# Patient Record
Sex: Female | Born: 1945
Health system: Southern US, Community
[De-identification: ages and names within clinical notes are randomized; demographics above are authoritative.]

## PROBLEM LIST (undated history)

## (undated) DIAGNOSIS — E785 Hyperlipidemia, unspecified: Secondary | ICD-10-CM

## (undated) DIAGNOSIS — Z8619 Personal history of other infectious and parasitic diseases: Secondary | ICD-10-CM

## (undated) HISTORY — DX: Personal history of other infectious and parasitic diseases: Z86.19

---

## 2005-11-07 ENCOUNTER — Emergency Department (HOSPITAL_COMMUNITY): Admission: EM | Admit: 2005-11-07 | Discharge: 2005-11-08 | Payer: Self-pay | Admitting: Emergency Medicine

## 2013-03-05 ENCOUNTER — Encounter (HOSPITAL_COMMUNITY): Payer: Self-pay | Admitting: Emergency Medicine

## 2013-03-05 ENCOUNTER — Emergency Department (HOSPITAL_COMMUNITY): Payer: Medicare Other

## 2013-03-05 ENCOUNTER — Emergency Department (HOSPITAL_COMMUNITY)
Admission: EM | Admit: 2013-03-05 | Discharge: 2013-03-06 | Disposition: A | Discharge status: Home / Self Care | Payer: Medicare Other | Attending: Emergency Medicine | Admitting: Emergency Medicine

## 2013-03-05 DIAGNOSIS — Z79899 Other long term (current) drug therapy: Secondary | ICD-10-CM | POA: Insufficient documentation

## 2013-03-05 DIAGNOSIS — J8410 Pulmonary fibrosis, unspecified: Secondary | ICD-10-CM | POA: Insufficient documentation

## 2013-03-05 DIAGNOSIS — Z9889 Other specified postprocedural states: Secondary | ICD-10-CM | POA: Insufficient documentation

## 2013-03-05 DIAGNOSIS — J189 Pneumonia, unspecified organism: Secondary | ICD-10-CM

## 2013-03-05 DIAGNOSIS — N390 Urinary tract infection, site not specified: Secondary | ICD-10-CM | POA: Insufficient documentation

## 2013-03-05 DIAGNOSIS — K5900 Constipation, unspecified: Secondary | ICD-10-CM | POA: Insufficient documentation

## 2013-03-05 DIAGNOSIS — E785 Hyperlipidemia, unspecified: Secondary | ICD-10-CM | POA: Insufficient documentation

## 2013-03-05 DIAGNOSIS — R112 Nausea with vomiting, unspecified: Secondary | ICD-10-CM | POA: Insufficient documentation

## 2013-03-05 DIAGNOSIS — R5381 Other malaise: Secondary | ICD-10-CM | POA: Insufficient documentation

## 2013-03-05 DIAGNOSIS — R630 Anorexia: Secondary | ICD-10-CM | POA: Insufficient documentation

## 2013-03-05 HISTORY — DX: Hyperlipidemia, unspecified: E78.5

## 2013-03-05 LAB — COMPREHENSIVE METABOLIC PANEL
AST: 35 U/L (ref 0–37)
BUN: 8 mg/dL (ref 6–23)
CO2: 21 mEq/L (ref 19–32)
Calcium: 8.9 mg/dL (ref 8.4–10.5)
Chloride: 92 mEq/L — ABNORMAL LOW (ref 96–112)
Creatinine, Ser: 0.5 mg/dL (ref 0.50–1.10)
GFR calc Af Amer: >90 mL/min (ref >90)
GFR calc non Af Amer: >90 mL/min (ref >90)
Total Bilirubin: 0.5 mg/dL (ref 0.3–1.2)

## 2013-03-05 LAB — CBC WITH DIFFERENTIAL/PLATELET
Basophils Absolute: 0 10*3/uL (ref 0.0–0.1)
Eosinophils Relative: 0 % (ref 0–5)
HCT: 36.3 % (ref 36.0–46.0)
Hemoglobin: 12.7 g/dL (ref 12.0–15.0)
Lymphocytes Relative: 7 % — ABNORMAL LOW (ref 12–46)
MCHC: 35 g/dL (ref 30.0–36.0)
MCV: 92.1 fL (ref 78.0–100.0)
Monocytes Absolute: 0.4 10*3/uL (ref 0.1–1.0)
Monocytes Relative: 5 % (ref 3–12)
Neutro Abs: 6.8 10*3/uL (ref 1.7–7.7)
RDW: 13.8 % (ref 11.5–15.5)
WBC: 7.7 10*3/uL (ref 4.0–10.5)

## 2013-03-05 LAB — URINALYSIS, ROUTINE W REFLEX MICROSCOPIC
Glucose, UA: NEGATIVE mg/dL
Ketones, ur: 15 mg/dL — AB
Protein, ur: 100 mg/dL — AB
pH: 7 (ref 5.0–8.0)

## 2013-03-05 LAB — LIPASE, BLOOD: Lipase: 18 U/L (ref 11–59)

## 2013-03-05 LAB — URINE MICROSCOPIC-ADD ON

## 2013-03-05 MED ORDER — SODIUM CHLORIDE 0.9 % IV BOLUS (SEPSIS)
500.0000 mL | Freq: Once | INTRAVENOUS | Status: AC
  Administered 2013-03-05: 500 mL via INTRAVENOUS

## 2013-03-05 MED ORDER — ONDANSETRON HCL 4 MG/2ML IJ SOLN
4.0000 mg | Freq: Once | INTRAMUSCULAR | Status: AC
  Administered 2013-03-05: 4 mg via INTRAVENOUS
  Filled 2013-03-05: qty 2

## 2013-03-05 MED ORDER — PROMETHAZINE HCL 25 MG PO TABS: 25.0000 mg | ORAL_TABLET | Freq: Four times a day (QID) | ORAL | Status: DC | PRN

## 2013-03-05 MED ORDER — TRAMADOL HCL 50 MG PO TABS: 50.0000 mg | ORAL_TABLET | Freq: Four times a day (QID) | ORAL | Status: DC | PRN

## 2013-03-05 NOTE — ED Notes (Signed)
Pt complains of generalized abdominal pain x2 days, also complains of generalized malaise and fever, also notes emesis. Was referred here from [facility].

## 2013-03-05 NOTE — ED Notes (Signed)
Upon speaking with pt she mentioned she had chest pain/pressure earlier today with som sob.

## 2013-03-05 NOTE — ED Notes (Signed)
EKG ordered and will notify dr asap

## 2013-03-05 NOTE — ED Provider Notes (Signed)
History    This chart was scribed for [name], MD by Melba Coon, ED Scribe. The patient was seen in room APA14/APA14 and the patient's care was started at 8:04PM.   CSN: [id number]  Arrival date & time 03/05/13  1945   First MD Initiated Contact with Patient 03/05/13 1957      Chief Complaint  Patient presents with  . Abdominal Pain    (Consider location/radiation/quality/duration/timing/severity/associated sxs/prior treatment) Patient is a 67 y.o. female presenting with abdominal pain. The history is provided by the patient. No language interpreter was used.  Abdominal Pain Pain location:  Generalized Pain quality: burning   Pain radiates to:  Does not radiate Pain severity:  Moderate Onset quality:  Gradual Duration:  2 days Timing:  Constant Progression:  Worsening Chronicity:  New Ineffective treatments: rocephin and levaquin. Associated symptoms: constipation, nausea and vomiting   Associated symptoms: no cough and no diarrhea    Michelle Odom is a 67 y.o. female who presents to the Emergency Department complaining of constant, moderate to severe generalized, burning abdominal pain with an onset 3-4 days ago with associated general malaise and emesis. She was referred here from [facility]. She went there yesertday was diagnosed with UTI and prescribed rocephin and Levaquin which did not alleviate her symptoms. She called [facility] today and told them that her symptoms had worsened. They referred her to the ED. She reports she had a fever that started 3 days ago and that it has not been alleviated; however, her body temperature here at the ED today is 97.6. Reports decreased appetite and fluid intake and constipation. Denies diarrhea and cough. Reports history of C-section about 40 years ago. No known allergies. No other pertinent medical symptoms.   Past Medical History  Diagnosis Date  . Hyperlipemia     History reviewed. No pertinent past surgical  history.  History reviewed. No pertinent family history.  History  Substance Use Topics  . Smoking status: Never Smoker   . Smokeless tobacco: Not on file  . Alcohol Use: No    OB History   Grav Para Term Preterm Abortions TAB SAB Ect Mult Living                  Review of Systems  Constitutional: Positive for appetite change (Decreased fluid intake and appetite).       (+) malaise  Respiratory: Negative for cough.   Gastrointestinal: Positive for nausea, vomiting, abdominal pain and constipation. Negative for diarrhea.  All other systems reviewed and are negative.    Allergies  Review of patient's allergies indicates no known allergies.  Home Medications  No current outpatient prescriptions on file.  BP 141/84  Pulse 84  Temp(Src) 97.6 F (36.4 C) (Oral)  Resp 16  Ht 5' 3.5" (1.613 m)  Wt 127 lb (57.607 kg)  BMI 22.14 kg/m2  SpO2 99%  Physical Exam  Nursing note and vitals reviewed. Constitutional: She is oriented to person, place, and time. She appears well-developed.  HENT:  Head: Normocephalic and atraumatic.  Mouth/Throat: Mucous membranes are dry.  Eyes: Conjunctivae and EOM are normal. No scleral icterus.  Neck: Neck supple. No thyromegaly present.  Cardiovascular: Normal rate and regular rhythm.  Exam reveals no gallop and no friction rub.   No murmur heard. Pulmonary/Chest: No stridor. She has no wheezes. She has no rales. She exhibits no tenderness.  Abdominal: She exhibits no distension. There is tenderness (mild; diffuse). There is no rebound.  Musculoskeletal:  Normal range of motion. She exhibits no edema.  Lymphadenopathy:    She has no cervical adenopathy.  Neurological: She is oriented to person, place, and time. Coordination normal.  Skin: No rash noted. No erythema.  Psychiatric: She has a normal mood and affect. Her behavior is normal.    ED Course  Procedures (including critical care time)  DIAGNOSTIC STUDIES: Oxygen Saturation is  99% on room air, normal by my interpretation.    COORDINATION OF CARE:  8:08PM - IV fluids, Zofran, abdominal acute XR with chest, CBC with differential, CMP, lipase, and UA will be ordered for Michelle Odom.   8:50PM - lab results reviewed Labs Reviewed  URINALYSIS, ROUTINE W REFLEX MICROSCOPIC - Abnormal; Notable for the following:    APPearance HAZY (*)    Hgb urine dipstick LARGE (*)    Ketones, ur 15 (*)    Protein, ur 100 (*)    Urobilinogen, UA 2.0 (*)    All other components within normal limits  CBC WITH DIFFERENTIAL - Abnormal; Notable for the following:    Neutrophils Relative 88 (*)    Lymphocytes Relative 7 (*)    Lymphs Abs 0.6 (*)    All other components within normal limits  COMPREHENSIVE METABOLIC PANEL - Abnormal; Notable for the following:    Sodium 127 (*)    Potassium 3.2 (*)    Chloride 92 (*)    Glucose, Bld 132 (*)    Albumin 3.1 (*)    All other components within normal limits  URINE MICROSCOPIC-ADD ON - Abnormal; Notable for the following:    Bacteria, UA FEW (*)    All other components within normal limits  LIPASE, BLOOD    9:20PM - imaging results reviewed Dg Abd Acute W/chest  03/05/2013  *RADIOLOGY REPORT*  Clinical Data: Fever, abdominal pain  ACUTE ABDOMEN SERIES (ABDOMEN 2 VIEW & CHEST 1 VIEW)  Comparison: 12/20 02/2005 chest radiograph  Findings: Mild left lung base opacity.  Lungs are otherwise clear. Cardiomediastinal contours within normal range.  Aortic atherosclerosis.  No free intraperitoneal air.  Bowel gas pattern nonobstructive. Calcific densities projecting over the right upper quadrant may reflect gallstones.  No acute osseous finding.  IMPRESSION: Nonobstructive bowel gas pattern.  Mild left lung base opacities; atelectasis (favored) versus early infiltrate.  Calcific densities projecting over the right upper quadrant may reflect gallstones.   Original Report Authenticated By: Jearld Lesch, M.D.    9:45PM - abdominal CT without  contrast will be ordered  No diagnosis found.    MDM   The chart was scribed for me under my direct supervision.  I personally performed the history, physical, and medical decision making and all procedures in the evaluation of this patient.[name], MD 03/08/13 [phone]

## 2014-07-01 ENCOUNTER — Emergency Department (INDEPENDENT_AMBULATORY_CARE_PROVIDER_SITE_OTHER)
Admission: EM | Admit: 2014-07-01 | Discharge: 2014-07-01 | Disposition: A | Discharge status: Home / Self Care | Payer: Medicare Other | Source: Home / Self Care | Attending: Emergency Medicine | Admitting: Emergency Medicine

## 2014-07-01 ENCOUNTER — Encounter (HOSPITAL_COMMUNITY): Payer: Self-pay | Admitting: Emergency Medicine

## 2014-07-01 ENCOUNTER — Emergency Department (INDEPENDENT_AMBULATORY_CARE_PROVIDER_SITE_OTHER): Payer: Medicare Other

## 2014-07-01 DIAGNOSIS — F419 Anxiety disorder, unspecified: Secondary | ICD-10-CM

## 2014-07-01 DIAGNOSIS — K2100 Gastro-esophageal reflux disease with esophagitis, without bleeding: Secondary | ICD-10-CM

## 2014-07-01 DIAGNOSIS — F411 Generalized anxiety disorder: Secondary | ICD-10-CM

## 2014-07-01 MED ORDER — GI COCKTAIL ~~LOC~~
ORAL | Status: AC
  Filled 2014-07-01: qty 30

## 2014-07-01 MED ORDER — SUCRALFATE 1 GM/10ML PO SUSP: 1.0000 g | Freq: Four times a day (QID) | ORAL | Status: DC

## 2014-07-01 MED ORDER — GI COCKTAIL ~~LOC~~
30.0000 mL | Freq: Once | ORAL | Status: AC
  Administered 2014-07-01: 30 mL via ORAL

## 2014-07-01 MED ORDER — LORAZEPAM 2 MG PO TABS: ORAL_TABLET | ORAL | Status: DC

## 2014-07-01 NOTE — Discharge Instructions (Signed)

## 2014-07-01 NOTE — ED Notes (Signed)
Pt reports chest d/c x 4 days Reports "it feels like heartburn"; increases w/food Also c/o weakness and anxiety Stopped taking Paxil x 2 months ago; not sleeping well Alert and talking in complete sentences w/no signs of acute distress.

## 2014-07-01 NOTE — ED Provider Notes (Signed)
Chief Complaint   Chief Complaint  Patient presents with  . Chest Pain    History of Present Illness    Michelle Odom is a 68 year old female who's had a one-week history of continuous substernal chest pain with radiation through to her back. This is described as a burning pain and is rated 7/10 in intensity. It's worse if she eats. It's not exertional. She's had indigestion and heartburn and she's taking Prilosec 20 mg once a day. She's had some burning in her throat, mild shortness of breath, nausea, sweats, chills, and abdominal discomfort. She denies any fever, coughing, wheezing, palpitations, dizziness, or syncope. She denies any cardiac history. She does have a history of reflux esophagitis. Also she had been on Paxil for anxiety for years. She decided to stop this about 2 months ago. She had a recurrence of her anxiety and was put on first half a pill a day the whole pill a day of Paxil 20 mg. She's only been on this for the last 1-2 weeks. She's having trouble sleeping at night and was placed on melatonin which does not help. She was then switched to clonazepam. This didn't help and she thought it made her heartburn and indigestion worse. She's felt nervous, anxious, and hasn't had much appetite and not been sleeping well at night.  Review of Systems    Other than noted above, the patient denies any of the following symptoms. Systemic:  No fever or chills. Pulmonary:  No cough, wheezing, shortness of breath, sputum production, hemoptysis. Cardiac:  No palpitations, rapid heartbeat, dizziness, presyncope or syncope. GI:  No abdominal pain, heartburn, nausea, or vomiting. Ext:  No leg pain or swelling.  PMFSH    Past medical history, family history, social history, meds, and allergies were reviewed.   Physical Exam     Vital signs:  BP 146/81  Pulse 71  Temp(Src) 98.8 F (37.1 C) (Oral)  Resp 20  SpO2 99% Gen:  Alert, oriented, in no distress, skin warm and dry. Eye:  PERRL,  lids and conjunctivas normal.  Sclera non-icteric. ENT:  Mucous membranes moist, pharynx clear. Neck:  Supple, no adenopathy or tenderness.  No JVD. Lungs:  Clear to auscultation, no wheezes, rales or rhonchi.  No respiratory distress. Heart:  Regular rhythm.  No gallops, murmers, clicks or rubs. Chest:  No chest wall tenderness. Abdomen:  Soft, nontender, no organomegaly or mass.  Bowel sounds normal.  No pulsatile abdominal mass or bruit. Ext:  No edema.  No calf tenderness and Homann's sign negative.  Pulses full and equal. Skin:  Warm and dry.  No rash.   Radiology     Dg Chest 2 View  07/01/2014   CLINICAL DATA:  Substernal chest pain.  EXAM: CHEST  2 VIEW  COMPARISON:  03/05/2013, CT and chest radiograph dated 11/08/2005.  FINDINGS: The heart size and mediastinal contours are within normal limits. Both lungs are clear. The visualized skeletal structures are unremarkable.  IMPRESSION: No active cardiopulmonary disease.   Electronically Signed   By: Amie Portlandavid  Ormond M.D.   On: 07/01/2014 09:57   I reviewed the images independently and personally and concur with the radiologist's findings.  EKG Results:  Date: 07/01/2014  Rate: 69  Rhythm: normal sinus rhythm  QRS Axis: normal--54  Intervals: normal  ST/T Wave abnormalities: normal  Conduction Disutrbances:none  Narrative Interpretation: Normal sinus rhythm, possibly left atrial enlargement, no significant change since previous tracing.  Old EKG Reviewed: none available    [facility]         She was given 30 mL of GI cocktail and experienced relief of her symptoms.                                                                                                                                                       Assessment     The primary encounter diagnosis was Gastroesophageal reflux disease with esophagitis. A diagnosis of Anxiety was also pertinent to this visit.  The chest pain is most likely due to reflux  esophagitis with a large component of anxiety as well. No evidence of acute coronary syndrome, pulmonary embolism, aortic dissection, pneumothorax, pericarditis, or esophageal rupture.  Plan     1.  Meds:  The following meds were prescribed:   Discharge Medication List as of 07/01/2014 10:41 AM    START taking these medications   Details  LORazepam (ATIVAN) 2 MG tablet 1/2 to 1 tablet every 8 hours as needed., Print    sucralfate (CARAFATE) 1 GM/10ML suspension Take 10 mLs (1 g total) by mouth 4 (four) times daily., Starting 07/01/2014, Until Discontinued, Normal        2.  Patient Education/Counseling:  The patient was given appropriate handouts, self care instructions, and instructed in symptomatic relief.   Discussed dietary measures for reflux.  3.  Follow up:  The patient was told to follow up here if no better in 3 to 4 days, or sooner if becoming worse in any way, and give an an some red flag symptoms such as worsening pain, shortness of breath, dizziness, or passing out which would prompt immediate return. Followup with a primary care physician within a week.     Reuben Likes, MD 07/01/14 [phone]

## 2015-09-08 ENCOUNTER — Encounter: Payer: Self-pay | Admitting: *Deleted

## 2015-09-08 DIAGNOSIS — Z0000 Encounter for general adult medical examination without abnormal findings: Secondary | ICD-10-CM

## 2015-09-10 NOTE — Congregational Nurse Program (Unsigned)
Congregational Nurse Program Note  Date of Encounter: 09/08/2015  Past Medical History: No past medical history on file.  Encounter Details:  Flu shot given   

## 2019-04-30 ENCOUNTER — Encounter: Payer: Self-pay | Admitting: Internal Medicine

## 2019-10-30 ENCOUNTER — Other Ambulatory Visit: Payer: Self-pay | Admitting: Gastroenterology

## 2019-10-30 ENCOUNTER — Other Ambulatory Visit (HOSPITAL_COMMUNITY): Payer: Self-pay | Admitting: Gastroenterology

## 2019-10-30 DIAGNOSIS — R1011 Right upper quadrant pain: Secondary | ICD-10-CM

## 2019-11-14 ENCOUNTER — Other Ambulatory Visit: Payer: Self-pay

## 2019-11-14 ENCOUNTER — Ambulatory Visit (HOSPITAL_COMMUNITY)
Admission: RE | Admit: 2019-11-14 | Discharge: 2019-11-14 | Disposition: A | Discharge status: Home / Self Care | Payer: Medicare Other | Source: Ambulatory Visit | Attending: Gastroenterology | Admitting: Gastroenterology

## 2019-11-14 ENCOUNTER — Encounter (HOSPITAL_COMMUNITY)
Admission: RE | Admit: 2019-11-14 | Discharge: 2019-11-14 | Disposition: A | Discharge status: Home / Self Care | Payer: Medicare Other | Source: Ambulatory Visit | Attending: Gastroenterology | Admitting: Gastroenterology

## 2019-11-14 DIAGNOSIS — R1011 Right upper quadrant pain: Secondary | ICD-10-CM | POA: Diagnosis present

## 2019-11-14 MED ORDER — TECHNETIUM TC 99M MEBROFENIN IV KIT
5.3000 | PACK | Freq: Once | INTRAVENOUS | Status: AC | PRN
  Administered 2019-11-14: 11:00:00 5.3 via INTRAVENOUS

## 2021-07-31 IMAGING — NM NM HEPATO W/GB/PHARM/[PERSON_NAME]
2 series · 12 of 12 positions shown · non-contrast
Comparison: None.

CLINICAL DATA: Chest and upper abdominal pain

EXAM:
NUCLEAR MEDICINE HEPATOBILIARY IMAGING WITH GALLBLADDER EF
VIEWS:
Sequential images of the abdomen were obtained [DATE] minutes
following intravenous administration of anterior right upper
quadrant
RADIOPHARMACEUTICALS:  5.3 mCi Ic-OOm  Choletec IV

[he hepatobiliary · 4.52mm/px · 6 of 60 frames shown (1 of 2)]
[frame 6/60]
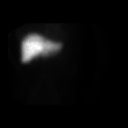
[frame 16/60]
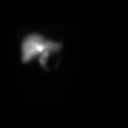
[frame 26/60]
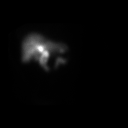
[frame 36/60]
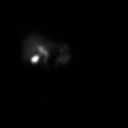
[frame 46/60]
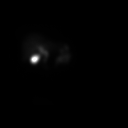
[frame 56/60]
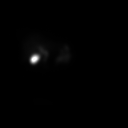

[he hepatobiliary · 4.52mm/px · 6 of 60 frames shown (2 of 2)]
[frame 6/60]
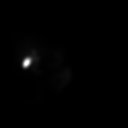
[frame 16/60]
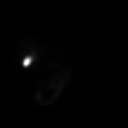
[frame 26/60]
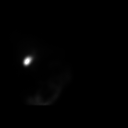
[frame 36/60]
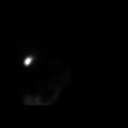
[frame 46/60]
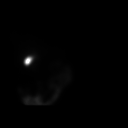
[frame 56/60]
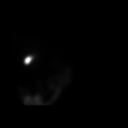

[12 of 12 positions shown; findings below may reference images not displayed]

FINDINGS: Liver uptake of radiotracer is unremarkable. There is prompt
visualization of gallbladder and small bowel, indicating patency of
the cystic and common bile ducts. The patient consumed 8 ounces of
Ensure orally with calculation of the computer generated ejection
fraction of radiotracer from the gallbladder. No report of clinical
symptoms with the oral Ensure consumption. The computer generated
ejection fraction of radiotracer from the gallbladder is within
normal limits at 44%, normal greater than 33% using the oral agent.
IMPRESSION: Study within normal limits.

## 2023-04-08 ENCOUNTER — Encounter (HOSPITAL_COMMUNITY): Payer: Self-pay | Admitting: Emergency Medicine

## 2023-04-08 ENCOUNTER — Other Ambulatory Visit: Payer: Self-pay

## 2023-04-08 ENCOUNTER — Emergency Department (HOSPITAL_COMMUNITY): Payer: Medicare Other

## 2023-04-08 ENCOUNTER — Emergency Department (HOSPITAL_COMMUNITY)
Admission: EM | Admit: 2023-04-08 | Discharge: 2023-04-08 | Disposition: A | Discharge status: Home / Self Care | Payer: Medicare Other | Attending: Emergency Medicine | Admitting: Emergency Medicine

## 2023-04-08 DIAGNOSIS — R079 Chest pain, unspecified: Secondary | ICD-10-CM | POA: Diagnosis present

## 2023-04-08 DIAGNOSIS — K2100 Gastro-esophageal reflux disease with esophagitis, without bleeding: Secondary | ICD-10-CM | POA: Diagnosis not present

## 2023-04-08 DIAGNOSIS — F419 Anxiety disorder, unspecified: Secondary | ICD-10-CM | POA: Insufficient documentation

## 2023-04-08 LAB — CBC
HCT: 40.9 % (ref 36.0–46.0)
Hemoglobin: 13.7 g/dL (ref 12.0–15.0)
MCH: 32.5 pg (ref 26.0–34.0)
MCHC: 33.5 g/dL (ref 30.0–36.0)
MCV: 97.1 fL (ref 80.0–100.0)
Platelets: 251 10*3/uL (ref 150–400)
RBC: 4.21 MIL/uL (ref 3.87–5.11)
RDW: 13.5 % (ref 11.5–15.5)
WBC: 6.9 10*3/uL (ref 4.0–10.5)
nRBC: 0 % (ref 0.0–0.2)

## 2023-04-08 LAB — BASIC METABOLIC PANEL
Anion gap: 9 (ref 5–15)
BUN: 15 mg/dL (ref 8–23)
CO2: 25 mmol/L (ref 22–32)
Calcium: 8.9 mg/dL (ref 8.9–10.3)
Chloride: 101 mmol/L (ref 98–111)
Creatinine, Ser: 0.48 mg/dL (ref 0.44–1.00)
GFR, Estimated: >60 mL/min (ref >60)
Glucose, Bld: 108 mg/dL — ABNORMAL HIGH (ref 70–99)
Potassium: 3.9 mmol/L (ref 3.5–5.1)
Sodium: 135 mmol/L (ref 135–145)

## 2023-04-08 LAB — TROPONIN I (HIGH SENSITIVITY)
Troponin I (High Sensitivity): 4 ng/L (ref <18)
Troponin I (High Sensitivity): 4 ng/L (ref <18)

## 2023-04-08 MED ORDER — LORAZEPAM 1 MG PO TABS
1.0000 mg | ORAL_TABLET | Freq: Once | ORAL | Status: AC
  Administered 2023-04-08: 1 mg via ORAL
  Filled 2023-04-08: qty 1

## 2023-04-08 MED ORDER — ALPRAZOLAM 0.5 MG PO TABS: 0.5000 mg | ORAL_TABLET | Freq: Two times a day (BID) | ORAL | 0 refills | Status: DC | PRN

## 2023-04-08 MED ORDER — SUCRALFATE 1 G PO TABS: 1.0000 g | ORAL_TABLET | Freq: Three times a day (TID) | ORAL | 0 refills | Status: AC

## 2023-04-08 MED ORDER — LIDOCAINE VISCOUS HCL 2 % MT SOLN
15.0000 mL | Freq: Once | OROMUCOSAL | Status: AC
  Administered 2023-04-08: 15 mL via ORAL
  Filled 2023-04-08: qty 15

## 2023-04-08 MED ORDER — ALUM & MAG HYDROXIDE-SIMETH 200-200-20 MG/5ML PO SUSP
30.0000 mL | Freq: Once | ORAL | Status: AC
  Administered 2023-04-08: 30 mL via ORAL
  Filled 2023-04-08: qty 30

## 2023-04-08 MED ORDER — PANTOPRAZOLE SODIUM 40 MG PO TBEC: 40.0000 mg | DELAYED_RELEASE_TABLET | Freq: Every day | ORAL | 0 refills | Status: AC

## 2023-04-08 NOTE — ED Triage Notes (Signed)
Pt via POV c/o central nonradiating, burning chest pain and anxiety since earlier this week. Pt takes desvenlafax and trazodone for anxiety with no improvement. Denies similar symptoms with previous panic attacks. Pt reports occasional episodes of nausea and sweating but denies other cardiac symptoms.

## 2023-04-08 NOTE — ED Provider Notes (Signed)
[place] [facility] AT [facility] Provider Note   CSN: [id number] Arrival date & time: 04/08/23  1534     History Chief Complaint  Patient presents with   Chest Pain   Panic Attack    Michelle Odom is a 77 y.o. female.  Patient presents emergency department complaints of chest pain/panic attack.  Past history significant for anxiety, depression, insomnia.  Patient recently had a change in medications and was started on desvenlafaxine and trazodone to help address her mental health.  She reports that she has not had significant improvement in symptoms/difficulty sleeping.  Has tried seeing Christain Sacramento, NP for psychiatry.   Chest Pain      Home Medications Prior to Admission medications   Medication Sig Start Date End Date Taking? Authorizing Provider  ALPRAZolam Prudy Feeler) 0.5 MG tablet Take 1 tablet (0.5 mg total) by mouth 2 (two) times daily as needed for anxiety. 04/08/23  Yes Smitty Knudsen, PA-C  desvenlafaxine (PRISTIQ) 25 MG 24 hr tablet Take 25 mg by mouth at bedtime. 03/17/23  Yes [provider]  ibandronate (BONIVA) 150 MG tablet Take 150 mg by mouth every 30 (thirty) days. 01/19/23  Yes [provider]  pantoprazole (PROTONIX) 40 MG tablet Take 1 tablet (40 mg total) by mouth daily. 04/08/23 05/08/23 Yes Smitty Knudsen, PA-C  sucralfate (CARAFATE) 1 g tablet Take 1 tablet (1 g total) by mouth 4 (four) times daily -  with meals and at bedtime for 14 days. 04/08/23 04/22/23 Yes Smitty Knudsen, PA-C  tetrahydrozoline (EYE DROPS) 0.05 % ophthalmic solution Place 1 drop into both eyes daily as needed (for irritation).   Yes [provider]  traZODone (DESYREL) 100 MG tablet Take 100 mg by mouth at bedtime. 03/17/23  Yes [provider]      Allergies    Patient has no known allergies.    Review of Systems   Review of Systems  Cardiovascular:  Positive for chest pain.  All other systems reviewed and are negative.   Physical  Exam Updated Vital Signs BP 127/85 (BP Location: Left Arm)   Pulse 71   Temp 98.2 F (36.8 C) (Oral)   Resp 18   Ht 5' 3.5" (1.613 m)   Wt 47.2 kg   SpO2 99%   BMI 18.13 kg/m  Physical Exam Vitals and nursing note reviewed.  Constitutional:      General: She is not in acute distress.    Appearance: She is well-developed.  HENT:     Head: Normocephalic and atraumatic.  Eyes:     Conjunctiva/sclera: Conjunctivae normal.  Cardiovascular:     Rate and Rhythm: Normal rate and regular rhythm.     Heart sounds: Normal heart sounds. No murmur heard. Pulmonary:     Effort: Pulmonary effort is normal. No respiratory distress.     Breath sounds: Normal breath sounds.  Abdominal:     Palpations: Abdomen is soft.     Tenderness: There is no abdominal tenderness.  Musculoskeletal:        General: No swelling.     Cervical back: Neck supple.  Skin:    General: Skin is warm and dry.     Capillary Refill: Capillary refill takes less than 2 seconds.  Neurological:     Mental Status: She is alert.  Psychiatric:        Mood and Affect: Mood is anxious.        Behavior: Behavior normal. Behavior is not agitated.  ED Results / Procedures / Treatments   Labs (all labs ordered are listed, but only abnormal results are displayed) Labs Reviewed  BASIC METABOLIC PANEL - Abnormal; Notable for the following components:      Result Value   Glucose, Bld 108 (*)    All other components within normal limits  CBC  TROPONIN I (HIGH SENSITIVITY)  TROPONIN I (HIGH SENSITIVITY)    EKG EKG Interpretation  Date/Time:  Thursday Apr 08 2023 15:50:33 EDT Ventricular Rate:  81 PR Interval:  148 QRS Duration: 76 QT Interval:  374 QTC Calculation: 434 R Axis:   27 Text Interpretation: Normal sinus rhythm Normal ECG When compared with ECG of 01-Jul-2014 09:25, No significant change was found No acute changes No significant change since last tracing Confirmed by Derwood Kaplan ([id number]) on  04/08/2023 7:42:34 PM  Radiology DG Chest 2 View  Result Date: 04/08/2023 CLINICAL DATA:  Central burning chest pain. EXAM: CHEST - 2 VIEW COMPARISON:  07/01/2014 FINDINGS: The heart size and mediastinal contours are within normal limits. Mild bibasilar atelectasis. There is no evidence of pulmonary edema, consolidation, pneumothorax, nodule or pleural fluid. The visualized skeletal structures are unremarkable. IMPRESSION: Mild bibasilar atelectasis. Electronically Signed   By: Irish Lack M.D.   On: 04/08/2023 16:12    Procedures Procedures   Medications Ordered in ED Medications  LORazepam (ATIVAN) tablet 1 mg (1 mg Oral Given 04/08/23 1821)  alum & mag hydroxide-simeth (MAALOX/MYLANTA) 200-200-20 MG/5ML suspension 30 mL (30 mLs Oral Given 04/08/23 1821)    And  lidocaine (XYLOCAINE) 2 % viscous mouth solution 15 mL (15 mLs Oral Given 04/08/23 1821)    ED Course/ Medical Decision Making/ A&P                           Medical Decision Making Amount and/or Complexity of Data Reviewed Labs: ordered. Radiology: ordered.  Risk OTC drugs. Prescription drug management.   This patient presents to the ED for concern of chest pain, and panic attack.  Differential diagnosis includes ACS, esophagitis, GERD, panic attack, generalized anxiety disorder   Lab Tests:  I Ordered, and personally interpreted labs.  The pertinent results include: CBC and BMP unremarkable, troponin negative   Imaging Studies ordered:  I ordered imaging studies including chest x-ray I independently visualized and interpreted imaging which showed no acute cardiopulmonary process I agree with the radiologist interpretation   Medicines ordered and prescription drug management:  I ordered medication including lorazepam, Maalox/Mylanta for anxiety, acid reflux Reevaluation of the patient after these medicines showed that the patient improved I have reviewed the patients home medicines and have made  adjustments as needed   Problem List / ED Course:  Patient presents emergency department complaints of chest pain and panic attacks.  States that she was recently started on desvenlafaxine and trazodone for anxiety and sleep.  States that sleep typically consist of 1/2-hour periodic "nap". Requesting to have referral to new psych provider arranged. Patient's symptoms concerning for potential anxiety component, but cardiac workup initiated with normal EKG and negative troponin. All other labs unremarkable.  Low concern the patient's symptoms are due to an acute MI at this time. Patient experienced symptomatic improvement with Lorazepam, and Maalox. High suspicion that symptoms are due to poorly managed anxiety.  Patient provided with referral to new psychiatrist per request.  Low-dose prescription for Xanax, Protonix, Carafate sent to patient's pharmacy.  Patient agreeable treatment plan verbalized understanding return precautions.  All  questions answered prior to patient discharge.  Final Clinical Impression(s) / ED Diagnoses Final diagnoses:  Anxiety  Gastroesophageal reflux disease with esophagitis without hemorrhage    Rx / DC Orders ED Discharge Orders          Ordered    sucralfate (CARAFATE) 1 g tablet  3 times daily with meals & bedtime        04/08/23 2128    pantoprazole (PROTONIX) 40 MG tablet  Daily        04/08/23 2128    ALPRAZolam (XANAX) 0.5 MG tablet  2 times daily PRN        04/08/23 2128    Ambulatory referral to Psychiatry        04/08/23 2129              Salomon Mast 04/08/23 2232    Derwood Kaplan, MD 04/10/23 1513

## 2023-04-08 NOTE — Discharge Instructions (Addendum)
You were seen in the emergency department for chest pain and anxiety. Thankfully your labs and imaging were reassuring without any acute findings noted. I would advise that you follow up with a psychiatrist regarding concerns for anxiety. Medications for acid reflux and anxiety were sent to your pharmacy. Please take these as prescribed. Otherwise, follow up with your primary care provider as needed. If you are concerned that your symptoms are worsening, please return to the ED for further evaluation.

## 2023-05-17 ENCOUNTER — Encounter (HOSPITAL_COMMUNITY): Payer: Self-pay | Admitting: Student

## 2023-05-17 ENCOUNTER — Ambulatory Visit (HOSPITAL_BASED_OUTPATIENT_CLINIC_OR_DEPARTMENT_OTHER): Payer: Medicare Other | Admitting: Student

## 2023-05-17 VITALS — BP 122/75 | HR 81 | Ht 63.0 in | Wt 108.6 lb

## 2023-05-17 DIAGNOSIS — F332 Major depressive disorder, recurrent severe without psychotic features: Secondary | ICD-10-CM

## 2023-05-17 DIAGNOSIS — F320 Major depressive disorder, single episode, mild: Secondary | ICD-10-CM | POA: Insufficient documentation

## 2023-05-17 DIAGNOSIS — F411 Generalized anxiety disorder: Secondary | ICD-10-CM

## 2023-05-17 MED ORDER — TRAZODONE HCL 150 MG PO TABS: 150.0000 mg | ORAL_TABLET | Freq: Every day | ORAL | 1 refills | Status: DC

## 2023-05-17 MED ORDER — HYDROXYZINE HCL 10 MG PO TABS: 10.0000 mg | ORAL_TABLET | Freq: Three times a day (TID) | ORAL | 1 refills | Status: DC | PRN

## 2023-05-17 MED ORDER — FLUOXETINE HCL 10 MG PO CAPS: 10.0000 mg | ORAL_CAPSULE | Freq: Every day | ORAL | 2 refills | Status: DC
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
## 2023-05-21 NOTE — Addendum Note (Signed)
Addended by: Everlena Cooper on: 05/21/2023 12:56 PM   Modules accepted: Level of Service

## 2023-05-27 ENCOUNTER — Encounter: Payer: Self-pay | Admitting: Geriatric Medicine

## 2023-05-28 ENCOUNTER — Other Ambulatory Visit: Payer: Self-pay | Admitting: Geriatric Medicine

## 2023-05-28 DIAGNOSIS — R1319 Other dysphagia: Secondary | ICD-10-CM

## 2023-06-07 ENCOUNTER — Ambulatory Visit (HOSPITAL_COMMUNITY): Payer: Medicare Other | Admitting: Student

## 2023-06-10 ENCOUNTER — Ambulatory Visit
Admission: RE | Admit: 2023-06-10 | Discharge: 2023-06-10 | Disposition: A | Discharge status: Home / Self Care | Payer: Medicare Other | Source: Ambulatory Visit | Attending: Geriatric Medicine | Admitting: Geriatric Medicine

## 2023-06-10 ENCOUNTER — Other Ambulatory Visit: Payer: Medicare Other

## 2023-06-10 DIAGNOSIS — R1319 Other dysphagia: Secondary | ICD-10-CM

## 2023-06-14 ENCOUNTER — Ambulatory Visit (HOSPITAL_COMMUNITY): Payer: Medicare Other | Admitting: Student

## 2023-06-14 ENCOUNTER — Encounter (HOSPITAL_COMMUNITY): Payer: Self-pay | Admitting: Student

## 2023-06-14 DIAGNOSIS — F411 Generalized anxiety disorder: Secondary | ICD-10-CM

## 2023-06-14 DIAGNOSIS — F332 Major depressive disorder, recurrent severe without psychotic features: Secondary | ICD-10-CM

## 2023-06-14 NOTE — Progress Notes (Signed)
[facility] [address] [place] [address] [place] [address] Dept: [phone] Dept Fax: [phone]  Psychotherapy Progress Note  Patient ID: Michelle Odom, female  DOB: 04-23-1946, 77 y.o.  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 Negative thoughts. Questioning whether she made the right decision. Helping him daily; believes that he needs her. She fears that the business will fail without her. So far, he has been making the payments, but this month noticed slowing of business. Husband states that business is typically slower around this time. Does not know what to do, developed somatic sx, throat sensation and pain in back of head,   Religion is protective, but constantly worrying, which makes her feel bad. Tried to turn off, but coming back. She is questioning whether she is a good Christian due to her constant worrying.    Current Symptoms: Anxiety, Appetite problem, Depressed Mood, Irritability, and Sibling problem; Irritability over the past two weeks. Appetite poor but forcing self to eat. Not sleeping well; goes to bed at 11 PM, wakes at 1-2 AM for about 2 hours thinking. Unable to nap throuhgout the day, as thoughts do not turn off.   Psychiatric Specialty Exam: General Appearance: Casual  Eye Contact:  Good  Speech:  Clear and Coherent and Normal Rate  Volume:  Normal  Mood:  Anxious and Depressed  Affect:  Congruent and Depressed  Thought Process:  Goal Directed  Orientation:  Full (Time, Place, and Person)  Thought Content:  Rumination  Suicidal Thoughts:  No  Homicidal Thoughts:  No  Memory:  Immediate;   Good Recent;   Good Remote;   Good  Judgement:  Good  Insight:  Fair  Psychomotor Activity:  Normal  Concentration:  Concentration: Good and Attention Span:  Good  Recall:   Not formally assessed  Fund of Knowledge:Good  Language: Good  Akathisia:  No  Handed:  Right  AIMS (if indicated):  not done  Assets:  Communication Skills Desire for Improvement Financial Resources/Insurance Housing Intimacy Leisure Time Resilience Social Support Talents/Skills  ADL's:  Intact  Cognition: WNL  Sleep:  Poor     Diagnosis: MDD, GAD  Anticipated Frequency of Visits: q 2 weeks Anticipated Length of Treatment Episode: 3-6 months  Short Term Goals/Goals for Treatment Session: Personal inventory, Psychoeducation: Framework for CBT, stages of psychosocial development- Integrity vs. despair  Progress Towards Goals: Initial  Treatment Intervention: Cognitive therapy and Psychoeducation  Medical Necessity: Assisted patient to achieve or maintain maximum functional capacity  Assessment Tools:    05/17/2023    3:19 PM  Depression screen PHQ 2/9  Decreased Interest 1  Down, Depressed, Hopeless 3  PHQ - 2 Score 4  Altered sleeping 3  Tired, decreased energy 3  Change in appetite 3  Feeling bad or failure about yourself  3  Trouble concentrating 3  Moving slowly or fidgety/restless 0  Suicidal thoughts 0  PHQ-9 Score 19   Failed to redirect to the Timeline version of the REVFS SmartLink. Flowsheet Row Office Visit from 05/17/2023 in [facility] from 04/08/2023 in [facility] Department at [facility]  C-SSRS RISK CATEGORY Error: Question 6 not populated No Risk       Collaboration of Care: Dr. Mercy Riding, Dr. Melvyn Neth  Patient/Guardian was advised Release of Information must be  obtained prior to any record release in order to collaborate their care with an outside provider. Patient/Guardian was advised if they have not already done so to contact the registration department to sign all necessary forms in order for [place] to release information regarding their care.   Consent: Patient/Guardian  gives verbal consent for treatment and assignment of benefits for services provided during this visit. Patient/Guardian expressed understanding and agreed to proceed.   Plan: Answer: What have I done well over the course of my life? What makes a good Christian?   Follow-up in two weeks.   Lamar Sprinkles, MD 06/14/2023

## 2023-06-24 ENCOUNTER — Other Ambulatory Visit: Payer: Self-pay | Admitting: Geriatric Medicine

## 2023-06-24 ENCOUNTER — Ambulatory Visit
Admission: RE | Admit: 2023-06-24 | Discharge: 2023-06-24 | Disposition: A | Discharge status: Home / Self Care | Payer: Medicare Other | Source: Ambulatory Visit | Attending: Geriatric Medicine | Admitting: Geriatric Medicine

## 2023-06-24 DIAGNOSIS — R1319 Other dysphagia: Secondary | ICD-10-CM

## 2023-06-28 ENCOUNTER — Encounter (HOSPITAL_COMMUNITY): Payer: Self-pay | Admitting: Student

## 2023-06-28 ENCOUNTER — Ambulatory Visit (HOSPITAL_COMMUNITY): Payer: Medicare Other | Admitting: Student

## 2023-06-28 DIAGNOSIS — F332 Major depressive disorder, recurrent severe without psychotic features: Secondary | ICD-10-CM | POA: Diagnosis not present

## 2023-06-28 DIAGNOSIS — F411 Generalized anxiety disorder: Secondary | ICD-10-CM

## 2023-06-28 NOTE — Progress Notes (Signed)
[facility] [address] [place] [address] [place] [address] Dept: [phone] Dept Fax: [phone]  Psychotherapy Progress Note  Patient ID: Wylodean Lozeau, female  DOB: 1946/02/08, 77 y.o.  MRN: [id number]  06/28/2023 Start time: 1410 End time: 1510  Method of Visit: Face-to-Face  Present: family (husband), Dr. Jolene Provost, PhD  Current Concerns: Today, 06/28/23, Improvements: heaviness in her head, per patient's reporting, and sleep, per husband's reporting. She has concerns now that she has aching joints, wants to lie down. When explored further, her aches appear to be somatization of her anxiety, as it improves when she lies down and relaxes. Would like to r/o true muscle pathology; patient to visit with PCP tomorrow and advised to address. She did fairly well with assignment: offering the things she has done well and defining what makes a good [place]. However, her humility does not allow her to take credit for the things she has done well. Additionally, she lists off the characteristics of a good Christian, which are attributes she possesses, but she is uncertain of them.    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 Negative thoughts. Questioning whether she made the right decision. Helping him daily; believes that he needs her. She fears that the business will fail without her. So far, he has been making the payments, but this month noticed slowing of business. Husband states that business is typically slower around this time. Does not know what to do, developed somatic sx, throat sensation and pain in back of head,   Religion is protective, but constantly worrying, which makes her feel bad. Tried to turn off, but coming back. She is questioning whether she is a good Christian due to her constant worrying.    Current Symptoms: Anxiety, Appetite problem, Depressed Mood, Irritability,  and Sibling problem  Psychiatric Specialty Exam: General Appearance: Casual  Eye Contact:  Good  Speech:  Clear and Coherent and Normal Rate  Volume:  Normal  Mood:  Anxious and Depressed  Affect:  Congruent and Depressed  Thought Process:  Goal Directed  Orientation:  Full (Time, Place, and Person)  Thought Content:  Rumination  Suicidal Thoughts:  No  Homicidal Thoughts:  No  Memory:  Immediate;   Good Recent;   Good Remote;   Good  Judgement:  Good  Insight:  Fair  Psychomotor Activity:  Normal  Concentration:  Concentration: Good and Attention Span: Good  Recall:   Not formally assessed  Fund of Knowledge:Good  Language: Good  Akathisia:  No  Handed:  Right  AIMS (if indicated):  not done  Assets:  Communication Skills Desire for Improvement Financial Resources/Insurance Housing Intimacy Leisure Time Resilience Social Support Talents/Skills  ADL's:  Intact  Cognition: WNL  Sleep:  Poor, but improving     Diagnosis: MDD, GAD  Anticipated Frequency of Visits: q 2 weeks Anticipated Length of Treatment Episode: 3-6 months  Short Term Goals/Goals for Treatment Session: Discussion surrounding change and letting go today.   Progress Towards Goals: Progressing  Treatment Intervention: Cognitive therapy and Psychoeducation  Medical Necessity: Assisted patient to achieve or maintain maximum functional capacity  Assessment Tools:    05/17/2023    3:19 PM  Depression screen PHQ 2/9  Decreased Interest 1  Down, Depressed, Hopeless 3  PHQ - 2 Score 4  Altered sleeping 3  Tired, decreased energy 3  Change in appetite 3  Feeling bad or failure about yourself  3  Trouble concentrating 3  Moving slowly or fidgety/restless 0  Suicidal thoughts 0  PHQ-9 Score 19   Failed to redirect to the Timeline version of the REVFS SmartLink. Flowsheet Row [facility] from [facility]08/2023 in [facility]  C-SSRS RISK CATEGORY Error: Question 6 not populated No Risk       Collaboration of Care: Dr. Melvyn Neth, Dr. Josephina Shih  Patient/Guardian was advised Release of Information must be obtained prior to any record release in order to collaborate their care with an outside provider. Patient/Guardian was advised if they have not already done so to contact the registration department to sign all necessary forms in order for [place] to release information regarding their care.   Consent: Patient/Guardian gives verbal consent for treatment and assignment of benefits for services provided during this visit. Patient/Guardian expressed understanding and agreed to proceed.   Plan: Action: 1. Patient committed to listening to Bible on audiobook 10 mins daily. 2. 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   Follow-up in two weeks.   Lamar Sprinkles, MD 06/28/2023

## 2023-07-12 ENCOUNTER — Ambulatory Visit (HOSPITAL_COMMUNITY): Payer: Medicare Other | Admitting: Student

## 2023-07-12 DIAGNOSIS — F332 Major depressive disorder, recurrent severe without psychotic features: Secondary | ICD-10-CM | POA: Diagnosis not present

## 2023-07-12 DIAGNOSIS — F411 Generalized anxiety disorder: Secondary | ICD-10-CM
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
## 2023-07-15 MED ORDER — FLUOXETINE HCL 20 MG PO CAPS: 20.0000 mg | ORAL_CAPSULE | Freq: Every day | ORAL | 1 refills | Status: DC

## 2023-07-15 MED ORDER — TRAZODONE HCL 150 MG PO TABS: 150.0000 mg | ORAL_TABLET | Freq: Every day | ORAL | 1 refills | Status: DC

## 2023-07-15 MED ORDER — HYDROXYZINE HCL 25 MG PO TABS: 25.0000 mg | ORAL_TABLET | Freq: Three times a day (TID) | ORAL | 0 refills | Status: AC | PRN

## 2023-07-16 NOTE — Addendum Note (Signed)
Addended by: Everlena Cooper on: 07/16/2023 10:10 AM   Modules accepted: Level of Service

## 2023-08-02 ENCOUNTER — Ambulatory Visit (HOSPITAL_BASED_OUTPATIENT_CLINIC_OR_DEPARTMENT_OTHER): Payer: Medicare Other | Admitting: Student

## 2023-08-02 DIAGNOSIS — F411 Generalized anxiety disorder: Secondary | ICD-10-CM

## 2023-08-02 DIAGNOSIS — F332 Major depressive disorder, recurrent severe without psychotic features: Secondary | ICD-10-CM

## 2023-08-02 NOTE — Patient Instructions (Addendum)
[facility] [address] [place], [place] [address]  Tomasita Morrow: This group of seniors, called the "Tomasita Morrow," meets every Tuesday at 12:30pm. Enjoy bingo, one-day-trips, fun activities, and so much more!   Silver Sneakers program membership: [email or web address]://tools.[email or web address] Call to activate: [phone] (TTY 711)

## 2023-08-02 NOTE — Progress Notes (Signed)
[facility] [address] [place] [address] [place] [address] Dept: [phone] Dept Fax: [phone]  Psychotherapy Progress Note  Patient ID: Michelle Odom, female  DOB: 28-Oct-1946, 77 y.o.  MRN: [id number]  08/02/2023 Start time: 1005 End time: 1105  Method of Visit: Face-to-Face  Present: patient  Current Concerns: Today, 08/02/23, patient reports that she does not feel differently due to persistence of psychosomatic symptoms. Although, she mentions improvements to her sleep. She has been reflecting a lot over the course of her life, worried that she has made incorrect or detrimental decisions. We spend today discussing change, her current psychosocial stage of development (integrity vs despair), and grief. 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 Reassurance is provided that she thought about her decision to sell, that she has been a good mother, sister, wife, etc, and she is Adult nurse. We continue to work on pulling away from the business and finding purpose and meaning in other aspects of life.   Also of note, patient had not yet begun to take increased dosages of medication. She was advised to particularly increase her Prozac dose to the 20mg  prescribed during her last MM visit. She was agreeable.  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 Negative thoughts. Questioning whether she made the right decision. Helping him daily; believes that he needs her. She fears that the business will fail without her. So far, he has been making the payments, but this month noticed slowing of business. Husband states that business is typically slower around this time. Does not know what to do, developed somatic sx, throat sensation and pain in back of head,   Religion is protective, but constantly worrying, which makes her feel bad. Tried to turn off, but coming back. She is  questioning whether she is a good Christian due to her constant worrying.    Current Symptoms: Anxiety, Appetite problem, Depressed Mood, Irritability, and Sibling problem  Psychiatric Specialty Exam: General Appearance: Casual  Eye Contact:  Good  Speech:  Clear and Coherent and Normal Rate  Volume:  Normal  Mood:  Anxious, Depressed, and Irritable  Affect:  Congruent, anxious, pensive  Thought Process:  Goal Directed  Orientation:  Full (Time, Place, and Person)  Thought Content:  Rumination  Suicidal Thoughts:  No  Homicidal Thoughts:  No  Memory:  Immediate;   Good Recent;   Good Remote;   Good  Judgement:  Fair  Insight:  Fair  Psychomotor Activity:  Normal  Concentration:  Concentration: Good and Attention Span: Good  Recall:   Not formally assessed  Fund of Knowledge:Good  Language: Good  Akathisia:  No  Handed:  Right  AIMS (if indicated):  not done  Assets:  Communication Skills Desire for Improvement Financial Resources/Insurance Housing Intimacy Leisure Time Resilience Social Support Talents/Skills  ADL's:  Intact  Cognition: WNL  Sleep:  Poor, but improved     Diagnosis: MDD, GAD  Anticipated Frequency of Visits: q 2 weeks Anticipated Length of Treatment Episode: 3-6 months  Short Term Goals/Goals for Treatment Session: Discussion surrounding change, grief, and letting go.  Progress Towards Goals: Progressing, as evidenced by reporting of a personal goal to completely let go of the business by January 2025 (6 months from July). She is realizing that her brother is going to do things differently. She is receptive when discussing how things were when she first started the business, and how she had  to learn through trial and error, but she is not giving her brother the space to do the same.   Patient has committed to one action plan:  1. Patient committed to listening to Bible on audiobook 10 mins daily.  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  Treatment Intervention: Cognitive therapy and Psychoeducation  Medical Necessity: Assisted patient to achieve or maintain maximum functional capacity  Assessment Tools:    05/17/2023    3:19 PM  Depression screen PHQ 2/9  Decreased Interest 1  Down, Depressed, Hopeless 3  PHQ - 2 Score 4  Altered sleeping 3  Tired, decreased energy 3  Change in appetite 3  Feeling bad or failure about yourself  3  Trouble concentrating 3  Moving slowly or fidgety/restless 0  Suicidal thoughts 0  PHQ-9 Score 19   Failed to redirect to the Timeline version of the REVFS SmartLink. Flowsheet Row [facility] Visit [facility] from 04/08/2023 in [facility] Emergency Department at [facility]  C-SSRS RISK CATEGORY Error: Question 6 not populated No Risk       Collaboration of Care: Dr. Melvyn Neth  Patient/Guardian was advised Release of Information must be obtained prior to any record release in order to collaborate their care with an outside provider. Patient/Guardian was advised if they have not already done so to contact the registration department to sign all necessary forms in order for [place] to release information regarding their care.   Consent: Patient/Guardian gives verbal consent for treatment and assignment of benefits for services provided during this visit. Patient/Guardian expressed understanding and agreed to proceed.   Plan: Action:  1. 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   -Provided information for Silver Sneakers Program and [employer]'s Tomasita Morrow program on Tuesdays at 12:30, that she and her husband could attend together.  Follow-up in two weeks.   Lamar Sprinkles, MD 08/02/2023

## 2023-08-23 ENCOUNTER — Ambulatory Visit (HOSPITAL_COMMUNITY): Payer: Medicare Other | Admitting: Student

## 2023-08-30 ENCOUNTER — Ambulatory Visit (HOSPITAL_COMMUNITY): Payer: Medicare Other | Admitting: Student

## 2023-09-13 ENCOUNTER — Ambulatory Visit (HOSPITAL_BASED_OUTPATIENT_CLINIC_OR_DEPARTMENT_OTHER): Payer: Medicare Other | Admitting: Student

## 2023-09-13 DIAGNOSIS — F332 Major depressive disorder, recurrent severe without psychotic features: Secondary | ICD-10-CM

## 2023-09-13 DIAGNOSIS — F411 Generalized anxiety disorder: Secondary | ICD-10-CM
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
## 2023-09-14 MED ORDER — FLUOXETINE HCL 40 MG PO CAPS: 40.0000 mg | ORAL_CAPSULE | Freq: Every day | ORAL | 1 refills | Status: DC

## 2023-09-14 MED ORDER — TRAZODONE HCL 150 MG PO TABS: 150.0000 mg | ORAL_TABLET | Freq: Every day | ORAL | 1 refills | Status: DC

## 2023-09-15 ENCOUNTER — Encounter (HOSPITAL_COMMUNITY): Payer: Self-pay | Admitting: Student

## 2023-09-27 ENCOUNTER — Ambulatory Visit (HOSPITAL_COMMUNITY): Payer: Medicare Other | Admitting: Student

## 2023-09-27 DIAGNOSIS — F332 Major depressive disorder, recurrent severe without psychotic features: Secondary | ICD-10-CM

## 2023-09-27 DIAGNOSIS — F411 Generalized anxiety disorder: Secondary | ICD-10-CM | POA: Diagnosis not present

## 2023-09-27 NOTE — Progress Notes (Signed)
[facility] [address] [place] [address] Dept: [phone] Dept Fax: [phone]  Psychotherapy Progress Note  Patient ID: Michelle Odom, female  DOB: February 15, 1946, 77 y.o.  MRN: [id number]  09/27/2023 Start time: 1540 End time: 1605  Method of Visit: Face-to-Face  Present: patient   Current Concerns: Today, 09/27/23, patient objectively appears in much better spirits. She had a conversation with her sisters, who helped put things into perspective for her. She noticed that she had not been herself when worrying so she is working toward acceptance of where her brother and nephews are. She maintains her core values as her faith, family, and productivity. She is able to verbalize that in keeping all of these balanced, she will have to make a change to reduce her anxiety. 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 Negative thoughts. Questioning whether she made the right decision. Helping him daily; believes that he needs her. She fears that the business will fail without her. So far, he has been making the payments, but this month noticed slowing of business. Husband states that business is typically slower around this time. Does not know what to do, developed somatic sx, throat sensation and pain in back of head, and pain in all 4 extremities.     Current Symptoms: Anxiety, Depressed Mood, Irritability, and Other: Less depressed  Psychiatric Specialty Exam: General Appearance: Casual  Eye Contact:  Good  Speech:  Clear and Coherent and Normal Rate  Volume:  Normal  Mood:  Anxious and Depressed  Affect:  Congruent and Depressed  Thought Process:  Goal Directed  Orientation:  Full (Time, Place, and  Person)  Thought Content:  Rumination  Suicidal Thoughts:  No  Homicidal Thoughts:  No  Memory:  Immediate;   Good Recent;   Good Remote;   Good  Judgement:  Good  Insight:  Fair  Psychomotor Activity:  Normal  Concentration:  Concentration: Good and Attention Span: Good  Recall:   Not formally assessed  Fund of Knowledge:Good  Language: Good  Akathisia:  No  Handed:  Right  AIMS (if indicated):  not done  Assets:  Communication Skills Desire for Improvement Financial Resources/Insurance Housing Intimacy Leisure Time Resilience Social Support Talents/Skills  ADL's:  Intact  Cognition: WNL  Sleep:  Poor, but improving     Diagnosis: MDD, GAD  Anticipated Frequency of Visits: q 2 weeks Anticipated Length of Treatment Episode: 3-6 months  Short Term Goals/Goals for Treatment Session: Discussion surrounding change, letting go today, and finding meaning in a new phase of lie.   Progress Towards Goals: Progressing  Treatment Intervention: Cognitive therapy and Psychoeducation  Medical Necessity: Assisted patient to achieve or maintain maximum functional capacity  Assessment Tools:    05/17/2023    3:19 PM  Depression screen PHQ 2/9  Decreased Interest 1  Down, Depressed, Hopeless 3  PHQ - 2 Score 4  Altered sleeping 3  Tired, decreased energy 3  Change in appetite 3  Feeling bad or failure about yourself  3  Trouble concentrating 3  Moving slowly or fidgety/restless 0  Suicidal thoughts 0  PHQ-9 Score 19   Failed to redirect to the Timeline version of the REVFS SmartLink. Flowsheet Row Office Visit  from 05/17/2023 in [facility] from [facility]/08/2023 in [facility] Department at [facility]  C-SSRS RISK CATEGORY Error: Question 6 not populated No Risk       Collaboration of Care: Dr. Melvyn Neth  Patient/Guardian was advised Release of Information must be obtained prior to any record release in order to  collaborate their care with an outside provider. Patient/Guardian was advised if they have not already done so to contact the registration department to sign all necessary forms in order for [place] to release information regarding their care.   Consent: Patient/Guardian gives verbal consent for treatment and assignment of benefits for services provided during this visit. Patient/Guardian expressed understanding and agreed to proceed.   Plan: Action: 1. Patient committed to listening to Bible on audiobook 10 mins daily. 2. 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   Follow-up in two weeks.   Lamar Sprinkles, MD 09/27/2023

## 2023-10-18 ENCOUNTER — Ambulatory Visit (HOSPITAL_COMMUNITY): Payer: Medicare Other | Admitting: Student

## 2023-11-01 ENCOUNTER — Ambulatory Visit (HOSPITAL_COMMUNITY): Payer: Medicare Other | Admitting: Student

## 2023-11-22 ENCOUNTER — Ambulatory Visit (HOSPITAL_BASED_OUTPATIENT_CLINIC_OR_DEPARTMENT_OTHER): Payer: Medicare Other | Admitting: Student

## 2023-11-22 DIAGNOSIS — F321 Major depressive disorder, single episode, moderate: Secondary | ICD-10-CM

## 2023-11-22 DIAGNOSIS — F411 Generalized anxiety disorder: Secondary | ICD-10-CM

## 2023-11-22 MED ORDER — FLUOXETINE HCL 40 MG PO CAPS: 40.0000 mg | ORAL_CAPSULE | Freq: Every day | ORAL | 1 refills | Status: DC

## 2023-11-22 MED ORDER — TRAZODONE HCL 150 MG PO TABS: 150.0000 mg | ORAL_TABLET | Freq: Every day | ORAL | 1 refills | Status: DC

## 2023-11-22 NOTE — Progress Notes (Signed)
 [facility] [address] [place] [address] Dept: [phone] Dept Fax: [phone]  Psychotherapy Progress Note  Patient ID: Michelle Odom, female  DOB: 1946/04/08, 78 y.o.  MRN: [id number]  11/22/2023  Start time: 1038 End time: 1138  Method of Visit: Face-to-Face  Present: patient   Current Concerns: Today, 11/22/2023 , patient continues to appear in much better spirits.  She has less somatic complaints, but these have not completely dissipated.  She did enjoy her time with her daughter over the holiday, noting that she was able to sleep well due to decreased stress.  While visiting her daughter, she was not worried about the business.    There has been some regression from previously noted progress surrounding patient's thoughts about her brother as a psychologist, sport and exercise.  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  She decides on July, as her brother still has a substantial mortgage.  She states that she would have a significant loss if she pulls away now, but less of a loss (1 that she is willing to take) if she pulls out by her birthday.  She is noting positive response to her medication dose as well.  She does want to fill in her time with something else, but is currently unsure what she would do in the time that she is not in the business.  Her sleep has improved.  She is currently tracking her sleep with an app on her cell phone, getting an average of 8 to 9 hours nightly while she was visiting her daughter, which has since decreased to an average of 6 hours nightly since returning to [place] and worrying about her brother and the business.   She maintains her core values as her faith, family, and productivity. She is able to verbalize that in keeping all of these balanced, she will have to make a change to reduce her anxiety. With that, she  voices that she will begin to pull back from the store one day weekly, with the ultimate goal to find something other than the store to occupy her time.    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 Negative thoughts. Questioning whether she made the right decision. Helping him daily; believes that he needs her. She fears that the business will fail without her. So far, he has been making the payments, but this month noticed slowing of business. Husband states that business is typically slower around this time. Does not know what to do, developed somatic sx, throat sensation and pain in back of head, and pain in all 4 extremities.     Current Symptoms: Anxiety, Depressed Mood, Irritability, and Other: Less depressed and anxious  Psychiatric Specialty Exam: General Appearance: Casual  Eye Contact:  Good  Speech:  Clear and Coherent and Normal Rate  Volume:  Normal  Mood:  Anxious and Euthymic  Affect:  Congruent  Thought Process:  Goal Directed  Orientation:  Full (Time, Place, and Person)  Thought Content:  Rumination  Suicidal Thoughts:  No  Homicidal Thoughts:  No  Memory:  Immediate;   Good Recent;   Good Remote;   Good  Judgement:  Good  Insight:  Fair and Shallow  Psychomotor Activity:  Normal  Concentration:  Concentration: Good and Attention Span: Good  Recall:   Not formally assessed  Fund of Knowledge:Good  Language:  Good  Akathisia:  No  Handed:  Right  AIMS (if indicated):  not done  Assets:  Communication Skills Desire for Improvement Financial Resources/Insurance Housing Intimacy Leisure Time Resilience Social Support Talents/Skills  ADL's:  Intact  Cognition: WNL  Sleep:  Fair, improving     Diagnosis: MDD, GAD  Anticipated Frequency of Visits: monthly, per patient request.  Advised biweekly sessions. Anticipated Length of Treatment Episode: 3-6 months  Short Term Goals/Goals for Treatment Session: Discussion  again surrounding change, letting go today, and finding meaning in a new phase of lie.   Progress Towards Goals: Progressing  Treatment Intervention: Cognitive therapy and Psychoeducation  Medical Necessity: Assisted patient to achieve or maintain maximum functional capacity  Assessment Tools:    05/17/2023    3:19 PM  Depression screen PHQ 2/9  Decreased Interest 1  Down, Depressed, Hopeless 3  PHQ - 2 Score 4  Altered sleeping 3  Tired, decreased energy 3  Change in appetite 3  Feeling bad or failure about yourself  3  Trouble concentrating 3  Moving slowly or fidgety/restless 0  Suicidal thoughts 0  PHQ-9 Score 19   Failed to redirect to the Timeline version of the REVFS SmartLink. Flowsheet Row [facility] from 04/08/2023 in [facility] Emergency Department at [facility]  C-SSRS RISK CATEGORY Error: Question 6 not populated No Risk       Collaboration of Care: Dr. Ezzard  Patient/Guardian was advised Release of Information must be obtained prior to any record release in order to collaborate their care with an outside provider. Patient/Guardian was advised if they have not already done so to contact the registration department to sign all necessary forms in order for us  to release information regarding their care.   Consent: Patient/Guardian gives verbal consent for treatment and assignment of benefits for services provided during this visit. Patient/Guardian expressed understanding and agreed to proceed.   Plan: Action: 1. Patient committed to listening to Bible on audiobook 10 mins daily. 2. 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   Follow-up in one month.   Charmaine Myrtle, MD 11/22/2023

## 2023-12-27 ENCOUNTER — Ambulatory Visit (HOSPITAL_COMMUNITY): Payer: Medicare Other | Admitting: Student

## 2023-12-27 ENCOUNTER — Encounter (HOSPITAL_COMMUNITY): Payer: Self-pay | Admitting: Student

## 2023-12-27 VITALS — BP 102/70 | HR 67 | Ht 63.0 in | Wt 105.4 lb

## 2023-12-27 DIAGNOSIS — F320 Major depressive disorder, single episode, mild: Secondary | ICD-10-CM | POA: Diagnosis not present

## 2023-12-27 DIAGNOSIS — F411 Generalized anxiety disorder: Secondary | ICD-10-CM | POA: Diagnosis not present

## 2023-12-27 MED ORDER — TRAZODONE HCL 100 MG PO TABS: 100.0000 mg | ORAL_TABLET | Freq: Every day | ORAL | 1 refills | Status: DC

## 2023-12-27 MED ORDER — FLUOXETINE HCL 40 MG PO CAPS: 40.0000 mg | ORAL_CAPSULE | Freq: Every day | ORAL | 1 refills | Status: DC
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
## 2024-01-05 NOTE — Addendum Note (Signed)
Addended by: Everlena Cooper on: 01/05/2024 09:53 AM   Modules accepted: Level of Service

## 2024-01-31 ENCOUNTER — Ambulatory Visit (HOSPITAL_COMMUNITY): Payer: Medicare Other | Admitting: Student

## 2024-01-31 DIAGNOSIS — F320 Major depressive disorder, single episode, mild: Secondary | ICD-10-CM | POA: Diagnosis not present

## 2024-01-31 DIAGNOSIS — F411 Generalized anxiety disorder: Secondary | ICD-10-CM

## 2024-01-31 NOTE — Progress Notes (Signed)
 [facility] [address] [place] [address] [place] [address] Dept: [phone] Dept Fax: [phone]  Psychotherapy Progress Note  Patient ID: Michelle Odom, female  DOB: [birth year], 78 y.o.  MRN: [id number]  01/31/2024  Start time: 0805 End time: 0900  Method of Visit: Face-to-Face  Present: patient   Current Concerns: Today, 01/31/2024 , patient continues to report somatic complaints as her primary problem.  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  Of note, she has some weeks where she has been going into the store only 5 days rather than 6.  She does state that on the day that she is at home, she has no motivation to do anything, and lies down worrying.  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  We discuss her brother as a previous Psychologist, sport and exercise, and his success until marital problems ensued, at no fault of his own.  We also discussed how she has allowed her children to fail and find their way, but is not extending the same grace to her brother.  She does still have the core belief that her brother will fail without her being present and that she will fail her mother's request to protect her brother if he does fail.  She maintains her core values as her faith, family, and productivity. She is able to verbalize that in keeping  all of these balanced, she will have to make a change to reduce her anxiety.  She has achieved dropping 1 day on some weeks, and is encouraged to continue this slow break.  Her insight regarding her anxiety continues to improve, and while she has some readiness to put forth action, she still has more reluctance.  We will continue to explore and encourage the new activity.   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 Negative thoughts. Questioning whether she made the right decision. Helping him daily; believes that he needs her. She fears that the business will fail without her. So far, he has been making the payments, but this month noticed slowing of business. Husband states that business is typically slower around this time. Does not know what to do, developed somatic sx, throat sensation and pain in back of head, and pain in all 4 extremities.     Current Symptoms: Anxiety, Depressed Mood, Irritability, and Other: Less depressed and anxious  Psychiatric Specialty Exam: General Appearance: Casual  Eye Contact:  Good  Speech:  Clear and Coherent and Normal Rate  Volume:  Normal  Mood:  Anxious and Euthymic  Affect:  Congruent  Thought Process:  Goal Directed  Orientation:  Full (Time, Place, and Person)  Thought Content:  Rumination  Suicidal Thoughts:  No  Homicidal Thoughts:  No  Memory:  Immediate;   Good Recent;   Good Remote;  Good  Judgement:  Good  Insight:  Fair and Shallow, improving  Psychomotor Activity:  Normal  Concentration:  Concentration: Good and Attention Span: Good  Recall:   Not formally assessed  Fund of Knowledge:Good  Language: Good  Akathisia:  No  Handed:  Right  AIMS (if indicated):  not done  Assets:  Communication Skills Desire for Improvement Financial Resources/Insurance Housing Intimacy Leisure Time Resilience Social Support Talents/Skills  ADL's:  Intact  Cognition: WNL  Sleep:  Fair, improving      Diagnosis: MDD, GAD  Anticipated Frequency of Visits: monthly, per patient request.  Advised biweekly sessions. Anticipated Length of Treatment Episode: 3-6 months  Short Term Goals/Goals for Treatment Session: Discussion again surrounding change, letting go gradually so as not to cause as much disruption, and finding replacement.  Progress Towards Goals: Progressing  Treatment Intervention: Cognitive therapy and Psychoeducation  Medical Necessity: Assisted patient to achieve or maintain maximum functional capacity  Assessment Tools:    05/17/2023    3:19 PM  Depression screen PHQ 2/9  Decreased Interest 1  Down, Depressed, Hopeless 3  PHQ - 2 Score 4  Altered sleeping 3  Tired, decreased energy 3  Change in appetite 3  Feeling bad or failure about yourself  3  Trouble concentrating 3  Moving slowly or fidgety/restless 0  Suicidal thoughts 0  PHQ-9 Score 19   Failed to redirect to the Timeline version of the REVFS SmartLink. Flowsheet Row [facility] from 04/08/2023 in [facility] Emergency Department at [facility]  C-SSRS RISK CATEGORY Error: Question 6 not populated No Risk       Collaboration of Care: Dr. Melvyn Neth  Patient/Guardian was advised Release of Information must be obtained prior to any record release in order to collaborate their care with an outside provider. Patient/Guardian was advised if they have not already done so to contact the registration department to sign all necessary forms in order for [place] to release information regarding their care.   Consent: Patient/Guardian gives verbal consent for treatment and assignment of benefits for services provided during this visit. Patient/Guardian expressed understanding and agreed to proceed.   Plan: Action: 1. Patient committed to listening to Bible on audiobook 10 mins daily.-Patient continues to do so 2. 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   Lamar Sprinkles, MD 01/31/2024

## 2024-02-02 MED ORDER — FLUOXETINE HCL 40 MG PO CAPS: 40.0000 mg | ORAL_CAPSULE | Freq: Every day | ORAL | 1 refills | Status: DC

## 2024-02-02 MED ORDER — TRAZODONE HCL 100 MG PO TABS: 100.0000 mg | ORAL_TABLET | Freq: Every day | ORAL | 1 refills | Status: DC

## 2024-03-08 ENCOUNTER — Ambulatory Visit (HOSPITAL_BASED_OUTPATIENT_CLINIC_OR_DEPARTMENT_OTHER): Admitting: Student

## 2024-03-08 VITALS — BP 126/79 | HR 70 | Ht 63.0 in | Wt 102.6 lb

## 2024-03-08 DIAGNOSIS — F320 Major depressive disorder, single episode, mild: Secondary | ICD-10-CM

## 2024-03-08 DIAGNOSIS — F411 Generalized anxiety disorder: Secondary | ICD-10-CM | POA: Diagnosis not present

## 2024-03-08 NOTE — Progress Notes (Signed)
 [facility] [address] [place] [address] [place] [address] Dept: [phone] Dept Fax: [phone]  Psychotherapy Progress Note  Patient ID: Michelle Odom, female  DOB: 02/23/1946, 78 y.o.  MRN: [id number]  03/08/2024  Start time: 0805 End time: 0900  Method of Visit: Face-to-Face  Present: patient   Current Concerns: Today, 03/08/2024 , patient continues to report somatic complaints (pain in all limbs) as her primary problem.  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 She has been sleeping some better overall. She continues to note some weeks where she goes in 5 days, some days where she goes in late, and other days when she leaves early. There is no pattern to this.  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 Also, knowing that her friends and longtime customers are dying makes it somewhat easier to pull away.   From previous visit: She maintains her core values as her faith, family, and productivity. She is able to verbalize that in keeping all of these balanced, she will have to make a change to reduce her anxiety.  She has achieved dropping 1 day on some weeks, and is encouraged to continue this slow break.  Her insight regarding her anxiety continues to improve, and while she has some readiness to put forth action, she still has more reluctance.  We will continue to explore and encourage the new activity.   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 Negative thoughts. Questioning whether she made the right decision. Helping him daily; believes that he needs her. She fears that the business will fail without her. So far,  he has been making the payments, but this month noticed slowing of business. Husband states that business is typically slower around this time. Does not know what to do, developed somatic sx, throat sensation and pain in back of head, and pain in all 4 extremities.     Current Symptoms: Anxiety, Depressed Mood, Irritability, and Other: Less depressed and anxious  Psychiatric Specialty Exam: Vitals:   03/08/24 0810  Weight: 102 lb 9.6 oz (46.5 kg)  Height: 5\' 3"  (1.6 m)    General Appearance: Casual  Eye Contact:  Good  Speech:  Clear and Coherent and Normal Rate  Volume:  Normal  Mood:  Anxious and Euthymic; some less  Affect:  Congruent  Thought Process:  Goal Directed  Orientation:  Full (Time, Place, and Person)  Thought Content:  Rumination  Suicidal Thoughts:  No  Homicidal Thoughts:  No  Memory:  Immediate;   Good Recent;   Good Remote;   Good  Judgement:  Good  Insight:  Fair and Shallow, improving  Psychomotor Activity:  Normal  Concentration:  Concentration: Good and Attention Span: Good  Recall:   Not formally assessed  Fund of Knowledge:Good  Language: Good  Akathisia:  No  Handed:  Right  AIMS (if indicated):  not done  Assets:  Communication Skills Desire for Improvement Financial Resources/Insurance Housing Intimacy Leisure Time Resilience Social Support Talents/Skills  ADL's:  Intact  Cognition: WNL  Sleep:  Fair, improving     Diagnosis: MDD, GAD  Anticipated Frequency of Visits: monthly, per patient request.  Advised biweekly sessions. Anticipated Length of Treatment Episode: 3-6 months  Short Term Goals/Goals for Treatment Session: Discussion again surrounding change, letting go gradually so as not to cause as much disruption, and finding replacement.  Progress Towards Goals: Progressing, as evidenced by improving insight regarding her brother's differences in managing business. Also, some days where she doesn't go in, goes in late, or  leaves early.   Room for continued improvement: Continuing to gradually pull away from the business. Trusting that she made the right decision selling the business to her brother. Letting go of her brother, recognizing that she did a good job with him and upheld mom's promises.   Treatment Intervention: Cognitive therapy and Psychoeducation.   Medical Necessity: Assisted patient to achieve or maintain maximum functional capacity  Assessment Tools:    05/17/2023    3:19 PM  Depression screen PHQ 2/9  Decreased Interest 1  Down, Depressed, Hopeless 3  PHQ - 2 Score 4  Altered sleeping 3  Tired, decreased energy 3  Change in appetite 3  Feeling bad or failure about yourself  3  Trouble concentrating 3  Moving slowly or fidgety/restless 0  Suicidal thoughts 0  PHQ-9 Score 19   Failed to redirect to the Timeline version of the REVFS SmartLink. Flowsheet Row [facility] from 04/08/2023 in [facility] Emergency Department at [facility]  C-SSRS RISK CATEGORY Error: Question 6 not populated No Risk       Collaboration of Care: Dr. Harles Lied  Patient/Guardian was advised Release of Information must be obtained prior to any record release in order to collaborate their care with an outside provider. Patient/Guardian was advised if they have not already done so to contact the registration department to sign all necessary forms in order for us  to release information regarding their care.   Consent: Patient/Guardian gives verbal consent for treatment and assignment of benefits for services provided during this visit. Patient/Guardian expressed understanding and agreed to proceed.   Plan: Action: 1. Patient committed to listening to Bible on audiobook 10 mins daily.-Patient continues to do so 2. Pick one day out of the week when she does not go to the store, one day to go in late, and one day to leave early. Provided  with a schedule and discussed sticking to it so that it becomes easier.  -Patient is making some progress  Follow-up in one month.   Shery Done, MD 03/08/2024

## 2024-03-14 ENCOUNTER — Other Ambulatory Visit (HOSPITAL_COMMUNITY): Payer: Self-pay | Admitting: Student

## 2024-03-14 ENCOUNTER — Encounter (HOSPITAL_COMMUNITY): Payer: Self-pay | Admitting: Student

## 2024-03-14 MED ORDER — FLUOXETINE HCL 40 MG PO CAPS: 40.0000 mg | ORAL_CAPSULE | Freq: Every day | ORAL | 1 refills | Status: DC

## 2024-03-14 MED ORDER — TRAZODONE HCL 50 MG PO TABS: 50.0000 mg | ORAL_TABLET | Freq: Every evening | ORAL | 1 refills | Status: DC | PRN

## 2024-04-05 ENCOUNTER — Ambulatory Visit (HOSPITAL_COMMUNITY): Admitting: Student

## 2024-04-12 ENCOUNTER — Ambulatory Visit (HOSPITAL_COMMUNITY): Admitting: Student

## 2024-04-12 DIAGNOSIS — F411 Generalized anxiety disorder: Secondary | ICD-10-CM

## 2024-04-12 DIAGNOSIS — F320 Major depressive disorder, single episode, mild: Secondary | ICD-10-CM | POA: Diagnosis not present

## 2024-04-12 MED ORDER — FLUOXETINE HCL 60 MG PO TABS: 60.0000 mg | ORAL_TABLET | Freq: Every morning | ORAL | 1 refills | Status: DC

## 2024-04-12 NOTE — Patient Instructions (Signed)
 May take one half tablet of Hydroxyzine  (12.5 mg) to prevent sleepiness. Otherwise take other medications as prescribed.
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
## 2024-04-13 ENCOUNTER — Other Ambulatory Visit (HOSPITAL_COMMUNITY): Payer: Self-pay | Admitting: *Deleted

## 2024-04-13 MED ORDER — FLUOXETINE HCL 40 MG PO CAPS: 40.0000 mg | ORAL_CAPSULE | Freq: Every day | ORAL | 1 refills | Status: DC

## 2024-04-13 MED ORDER — FLUOXETINE HCL 20 MG PO CAPS: 20.0000 mg | ORAL_CAPSULE | Freq: Every day | ORAL | 1 refills | Status: DC

## 2024-05-03 ENCOUNTER — Ambulatory Visit (HOSPITAL_COMMUNITY): Admitting: Student

## 2024-05-03 ENCOUNTER — Encounter (HOSPITAL_COMMUNITY): Payer: Self-pay | Admitting: Student

## 2024-05-03 VITALS — BP 98/61 | HR 71 | Wt 104.8 lb

## 2024-05-03 DIAGNOSIS — F320 Major depressive disorder, single episode, mild: Secondary | ICD-10-CM | POA: Diagnosis not present

## 2024-05-03 DIAGNOSIS — F411 Generalized anxiety disorder: Secondary | ICD-10-CM

## 2024-05-03 NOTE — Progress Notes (Unsigned)
 [facility] [address] [place] [address] Dept: [phone] Dept Fax: [phone]  Psychotherapy Progress Note  Patient ID: Michelle Odom, female  DOB: 24-Sep-1946, 78 y.o.  MRN: [id number]  05/03/2024  Start time: 0835 End time: 0940  Method of Visit: Face-to-Face  Present: patient, husband  Current Concerns: Today, 05/03/2024 , patient continues to report somatic complaints (pain in all limbs) as her primary problem.  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 She has been sleeping some better overall. She continues to note some weeks where she goes in 5 days, some days where she goes in late, and other days when she leaves early. There is no pattern to this.  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 Also, knowing that her friends and longtime customers are dying makes it somewhat easier to pull away.   From previous visit: She maintains her core values as her faith, family, and productivity. She is able to verbalize that in keeping all of these balanced, she will have to make a change to reduce her anxiety.  She has achieved dropping 1 day on some weeks, and is encouraged to continue this slow break.  Her insight regarding her anxiety continues to improve, and while she has some readiness to put forth action, she still has more reluctance.  We will continue to explore and encourage the new activity.   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 Negative thoughts. Questioning whether she made the right decision. Helping him daily; believes that he needs her. She fears that the business will fail without her.  So far, he has been making the payments, but this month noticed slowing of business. Husband states that business is typically slower around this time. Does not know what to do, developed somatic sx, throat sensation and pain in back of head, and pain in all 4 extremities.     Current Symptoms: Anxiety, Depressed Mood, Irritability, and Other: Less depressedand anxious  Psychiatric Specialty Exam: There were no vitals filed for this visit.   General Appearance: Casual  Eye Contact:  Good  Speech:  Clear and Coherent and Normal Rate  Volume:  Normal  Mood:  Anxious and Euthymic; some less  Affect:  Congruent  Thought Process:  Goal Directed  Orientation:  Full (Time, Place, and Person)  Thought Content:  Rumination  Suicidal Thoughts:  No  Homicidal Thoughts:  No  Memory:  Immediate;   Good Recent;   Good Remote;   Good  Judgement:  Good  Insight:  Fair and Shallow, improving  Psychomotor Activity:  Normal  Concentration:  Concentration: Good and Attention Span: Good  Recall:  Not formally assessed  Fund of Knowledge:Good  Language: Good  Akathisia:  No  Handed:  Right  AIMS (if indicated):  not done  Assets:  Communication Skills Desire for Improvement Financial Resources/Insurance Housing Intimacy Leisure Time Resilience Social Support Talents/Skills  ADL's:  Intact  Cognition: WNL  Sleep:  Fair, improving     Diagnosis: MDD, GAD  Anticipated Frequency of Visits: monthly, per patient request.  Advised biweekly sessions. Anticipated Length of Treatment Episode: 3-6 months  Short Term Goals/Goals  for Treatment Session: Discussion again surrounding change, letting go gradually so as not to cause as much disruption, and finding replacement.  Progress Towards Goals: Progressing, as evidenced by improving insight regarding her brother's differences in managing business. Also, some days where she doesn't go in, goes in late, or leaves early.   Room for continued  improvement: Continuing to gradually pull away from the business. Trusting that she made the right decision selling the business to her brother. Letting go of her brother, recognizing that she did a good job with him and upheld mom's promises.   Treatment Intervention: Cognitive therapy and Psychoeducation.   Medical Necessity: Assisted patient to achieve or maintain maximum functional capacity  Assessment Tools:    05/17/2023    3:19 PM  Depression screen PHQ 2/9  Decreased Interest 1  Down, Depressed, Hopeless 3  PHQ - 2 Score 4  Altered sleeping 3  Tired, decreased energy 3  Change in appetite 3  Feeling bad or failure about yourself  3  Trouble concentrating 3  Moving slowly or fidgety/restless 0  Suicidal thoughts 0  PHQ-9 Score 19   Failed to redirect to the Timeline version of the REVFS SmartLink. Flowsheet Row Office Visit from [facility]/17/20[facility] from 04/08/2023 in [facility] Emergency Department at [facility]  C-SSRS RISK CATEGORY Error: Question 6 not populated No Risk    Collaboration of Care: Dr. Harles Lied  Patient/Guardian was advised Release of Information must be obtained prior to any record release in order to collaborate their care with an outside provider. Patient/Guardian was advised if they have not already done so to contact the registration department to sign all necessary forms in order for us  to release information regarding their care.   Consent: Patient/Guardian gives verbal consent for treatment and assignment of benefits for services provided during this visit. Patient/Guardian expressed understanding and agreed to proceed.   Plan: Action: 1. Patient committed to listening to Bible on audiobook 10 mins daily.-Patient continues to do so 2. Pick one day out of the week when she does not go to the store, one day to go in late, and one day to leave early. Provided with a schedule and discussed sticking to it  so that it becomes easier.  -Patient is making some progress  Follow-up in one month.   Shery Done, MD 05/03/2024

## 2024-06-21 ENCOUNTER — Ambulatory Visit (HOSPITAL_COMMUNITY): Admitting: Psychiatry

## 2024-06-26 ENCOUNTER — Ambulatory Visit (HOSPITAL_COMMUNITY): Admitting: Psychiatry
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
## 2024-07-03 ENCOUNTER — Encounter (HOSPITAL_COMMUNITY): Payer: Self-pay | Admitting: Psychiatry

## 2024-07-03 ENCOUNTER — Ambulatory Visit (HOSPITAL_COMMUNITY): Admitting: Psychiatry

## 2024-07-03 VITALS — BP 113/74 | HR 73 | Wt 105.6 lb

## 2024-07-03 DIAGNOSIS — F411 Generalized anxiety disorder: Secondary | ICD-10-CM

## 2024-07-03 DIAGNOSIS — F320 Major depressive disorder, single episode, mild: Secondary | ICD-10-CM

## 2024-07-03 MED ORDER — TRAZODONE HCL 50 MG PO TABS: 50.0000 mg | ORAL_TABLET | Freq: Every evening | ORAL | 2 refills | Status: DC | PRN

## 2024-07-03 MED ORDER — FLUOXETINE HCL 40 MG PO CAPS: 40.0000 mg | ORAL_CAPSULE | Freq: Every day | ORAL | 2 refills | Status: DC

## 2024-07-03 MED ORDER — HYDROXYZINE HCL 10 MG PO TABS: 10.0000 mg | ORAL_TABLET | Freq: Every day | ORAL | 2 refills | Status: AC | PRN

## 2024-07-04 NOTE — Addendum Note (Signed)
 Addended by: CARVIN CROCK on: 07/04/2024 08:57 AM   Modules accepted: Level of Service
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
## 2024-10-02 ENCOUNTER — Ambulatory Visit (HOSPITAL_COMMUNITY): Admitting: Psychiatry

## 2024-10-02 DIAGNOSIS — F324 Major depressive disorder, single episode, in partial remission: Secondary | ICD-10-CM | POA: Diagnosis not present

## 2024-10-02 DIAGNOSIS — F411 Generalized anxiety disorder: Secondary | ICD-10-CM

## 2024-10-02 MED ORDER — FLUOXETINE HCL 40 MG PO CAPS
40.0000 mg | ORAL_CAPSULE | Freq: Every day | ORAL | 0 refills | Status: AC
Start: 2024-10-02 — End: 2024-12-31

## 2024-10-02 MED ORDER — TRAZODONE HCL 50 MG PO TABS: 50.0000 mg | ORAL_TABLET | Freq: Every evening | ORAL | 2 refills | Status: AC | PRN

## 2024-10-02 NOTE — Addendum Note (Signed)
 Addended by: CARVIN CROCK on: 10/02/2024 11:30 AM   Modules accepted: Level of Service

## 2024-12-25 ENCOUNTER — Ambulatory Visit (HOSPITAL_COMMUNITY): Admitting: Psychiatry
